# Patient Record
Sex: Female | Born: 1955 | Race: White | Hispanic: No | Marital: Married
Health system: Southern US, Community
[De-identification: ages and names within clinical notes are randomized; demographics above are authoritative.]

---

## 1999-05-31 ENCOUNTER — Other Ambulatory Visit: Admission: RE | Admit: 1999-05-31 | Discharge: 1999-05-31 | Payer: Self-pay | Admitting: *Deleted

## 1999-09-26 ENCOUNTER — Encounter: Admission: RE | Admit: 1999-09-26 | Discharge: 1999-09-26 | Payer: Self-pay | Admitting: *Deleted

## 2000-02-01 ENCOUNTER — Other Ambulatory Visit: Admission: RE | Admit: 2000-02-01 | Discharge: 2000-02-01 | Payer: Self-pay | Admitting: *Deleted

## 2000-06-26 ENCOUNTER — Other Ambulatory Visit: Admission: RE | Admit: 2000-06-26 | Discharge: 2000-06-26 | Payer: Self-pay | Admitting: *Deleted

## 2017-03-11 ENCOUNTER — Other Ambulatory Visit: Payer: Self-pay | Admitting: Sports Medicine

## 2017-03-11 ENCOUNTER — Other Ambulatory Visit: Payer: Self-pay | Admitting: Student

## 2017-03-12 ENCOUNTER — Other Ambulatory Visit: Payer: Self-pay | Admitting: Sports Medicine

## 2017-03-12 DIAGNOSIS — M25511 Pain in right shoulder: Secondary | ICD-10-CM

## 2017-03-26 ENCOUNTER — Ambulatory Visit
Admission: RE | Admit: 2017-03-26 | Discharge: 2017-03-26 | Disposition: A | Discharge status: Home / Self Care | Payer: BLUE CROSS/BLUE SHIELD | Source: Ambulatory Visit | Attending: Sports Medicine | Admitting: Sports Medicine

## 2017-03-26 DIAGNOSIS — M25511 Pain in right shoulder: Secondary | ICD-10-CM

## 2017-03-26 MED ORDER — IOPAMIDOL (ISOVUE-M 200) INJECTION 41%
20.0000 mL | Freq: Once | INTRAMUSCULAR | Status: AC
  Administered 2017-03-26: 20 mL via INTRA_ARTICULAR

## 2018-03-24 ENCOUNTER — Other Ambulatory Visit: Payer: Self-pay | Admitting: Family Medicine

## 2018-03-24 DIAGNOSIS — M25551 Pain in right hip: Secondary | ICD-10-CM

## 2018-03-29 ENCOUNTER — Other Ambulatory Visit: Payer: BLUE CROSS/BLUE SHIELD

## 2018-03-30 ENCOUNTER — Other Ambulatory Visit: Payer: Self-pay | Admitting: Family Medicine

## 2018-03-30 DIAGNOSIS — M25551 Pain in right hip: Secondary | ICD-10-CM

## 2018-03-31 ENCOUNTER — Inpatient Hospital Stay
Admission: RE | Admit: 2018-03-31 | Discharge: 2018-03-31 | Disposition: A | Discharge status: Home / Self Care | Payer: Self-pay | Source: Ambulatory Visit | Attending: Family Medicine | Admitting: Family Medicine

## 2018-04-02 ENCOUNTER — Other Ambulatory Visit: Payer: Self-pay

## 2018-04-11 ENCOUNTER — Ambulatory Visit
Admission: RE | Admit: 2018-04-11 | Discharge: 2018-04-11 | Disposition: A | Discharge status: Home / Self Care | Payer: Commercial Managed Care - PPO | Source: Ambulatory Visit | Attending: Family Medicine | Admitting: Family Medicine

## 2018-04-11 DIAGNOSIS — M25551 Pain in right hip: Secondary | ICD-10-CM

## 2019-01-21 ENCOUNTER — Telehealth: Payer: Self-pay

## 2019-01-21 NOTE — Telephone Encounter (Signed)
Referral notes received from [facility], Dr. Brett Fairy office. P: [phone] F: [phone] Notes sent to scheduling.

## 2019-12-24 IMAGING — MR MR HIP*R* W/O CM
4 of 5 series · 16 of 40 positions shown · non-contrast
Comparison: None.

CLINICAL DATA: Right hip and leg pain.

EXAM:
MR OF THE RIGHT HIP WITHOUT CONTRAST
TECHNIQUE: Multiplanar, multisequence MR imaging was performed. No intravenous
contrast was administered.

[Series 4: T1 · coronal · 4.0mm · 0.59mm/px · 3 of 19 slices shown (1 of 2)]
[im 4/19]
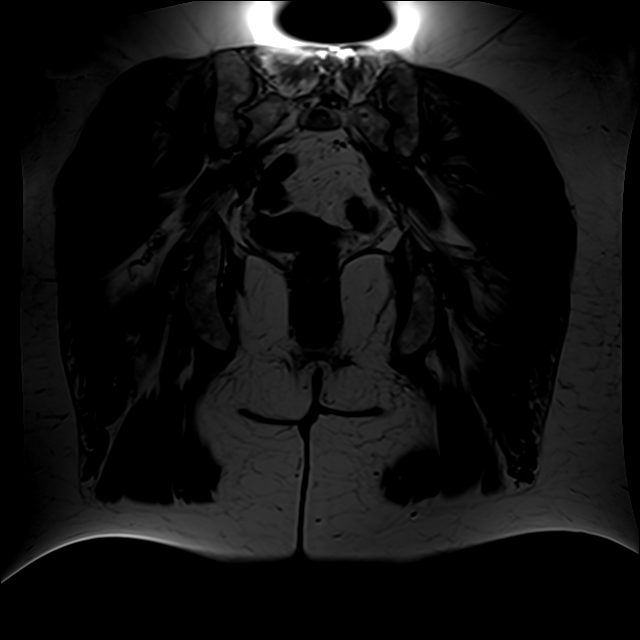
[im 10/19]
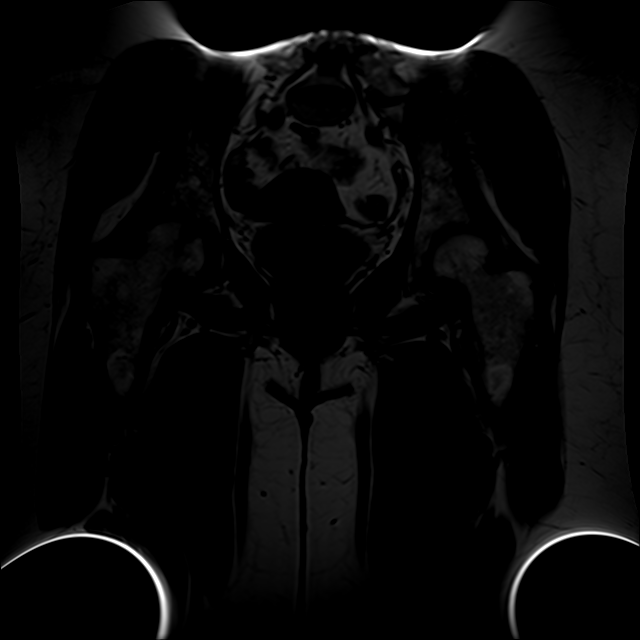
[im 16/19]
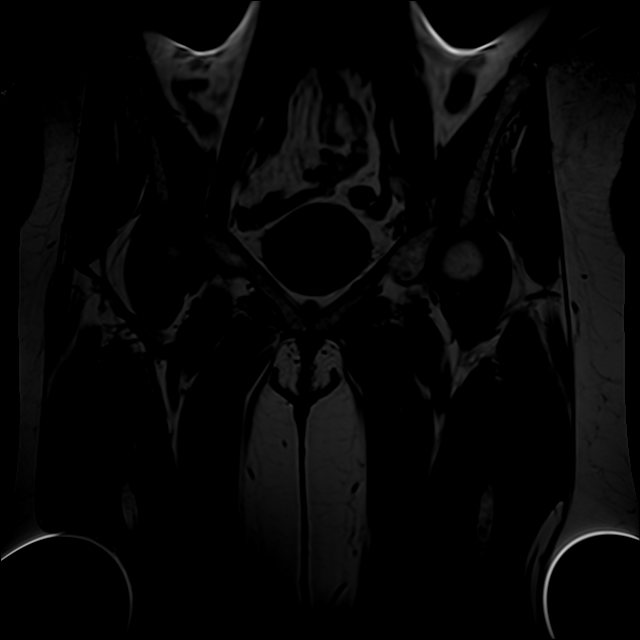

[Series 5: T2 fat-sat · axial · 4.0mm · 0.59mm/px · z∈[+0,+87]mm · 3 of 25 slices shown]
[im 4/25]
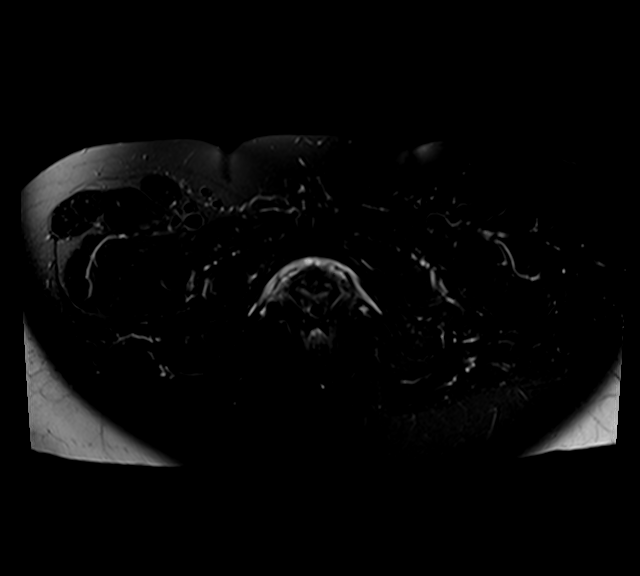
[im 13/25]
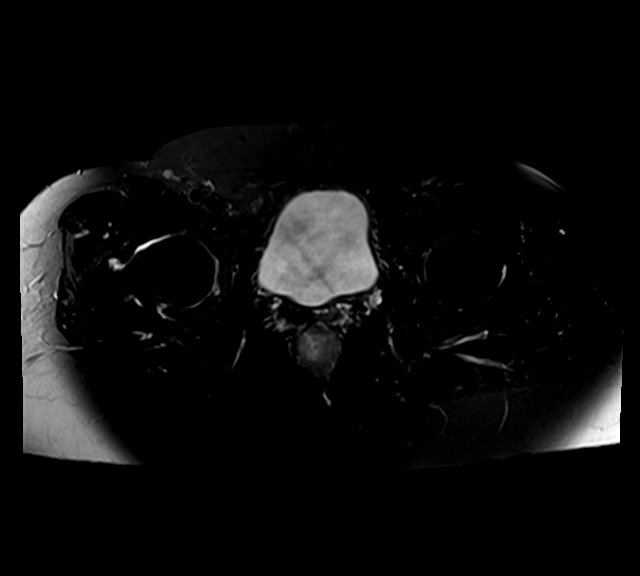
[im 22/25]
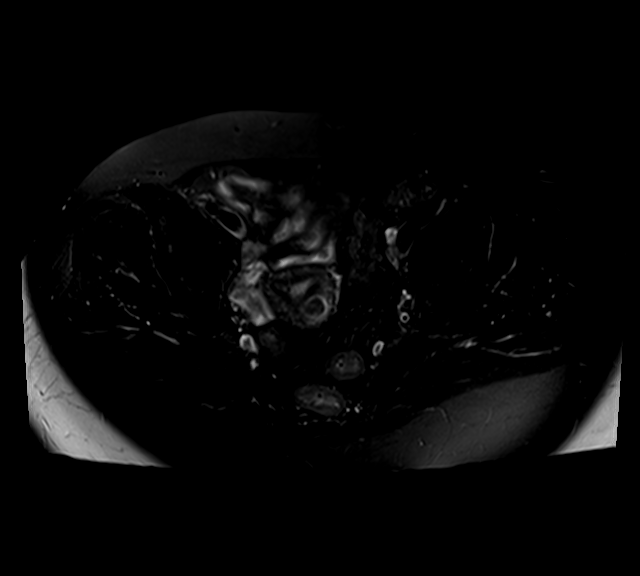

[Series 6: T1 · axial · 4.0mm · 0.59mm/px · z∈[+0,+87]mm · 3 of 25 slices shown (2 of 2)]
[im 4/25]
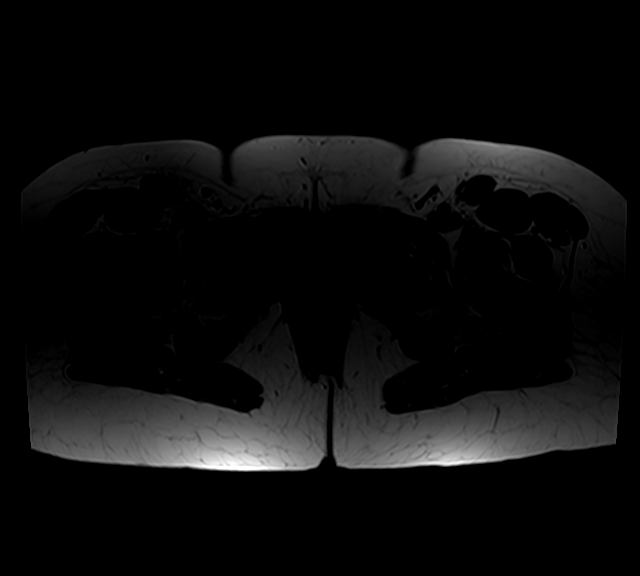
[im 13/25]
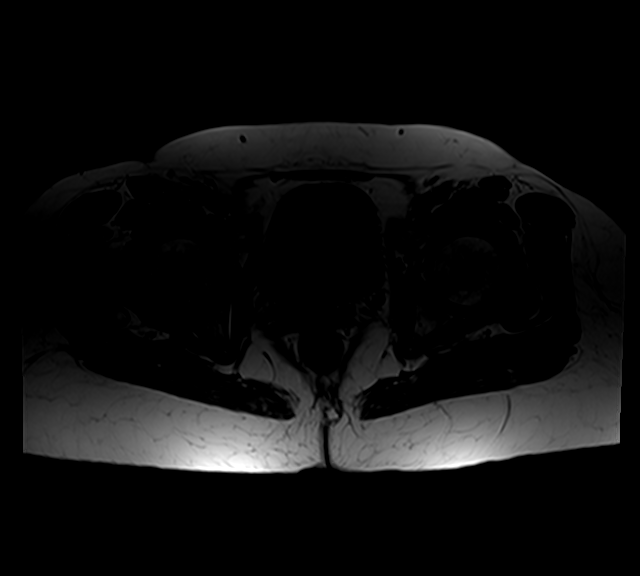
[im 22/25]
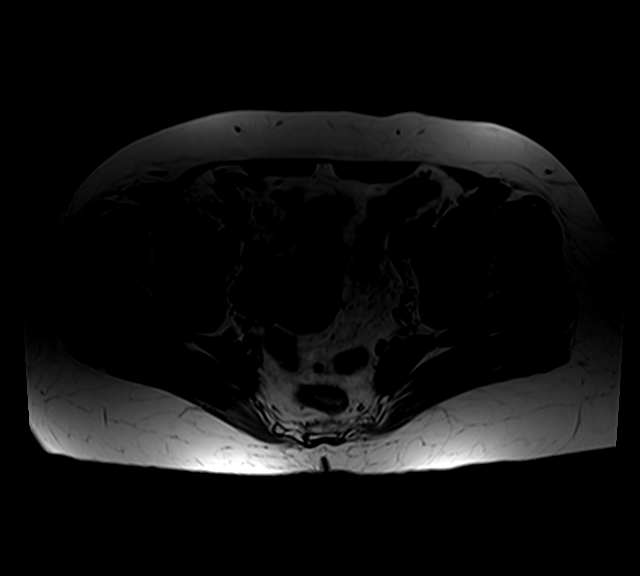

[Series 7: PD fat-sat · sagittal · 4.0mm · 0.35mm/px · 7 of 20 slices shown]
[im 1/20]
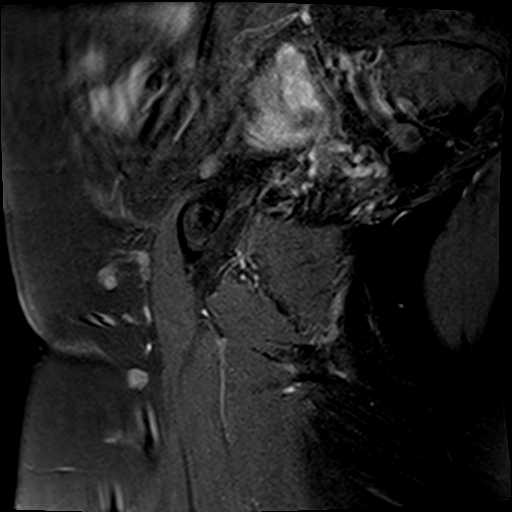
[im 4/20]
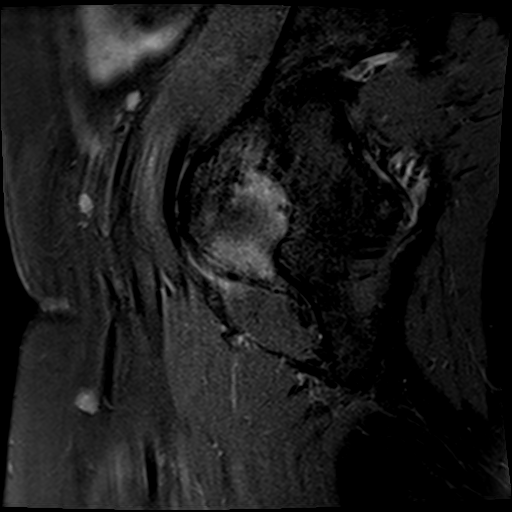
[im 7/20]
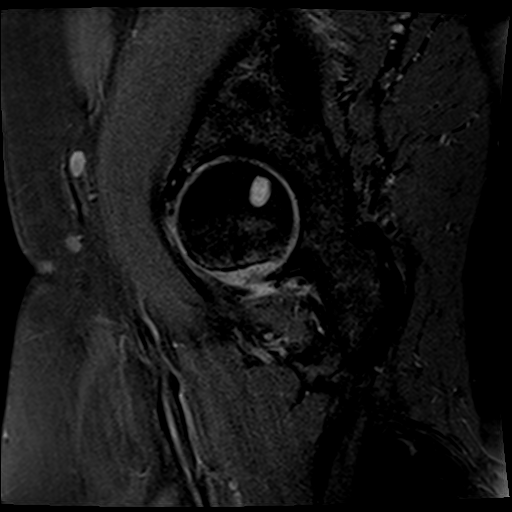
[im 10/20]
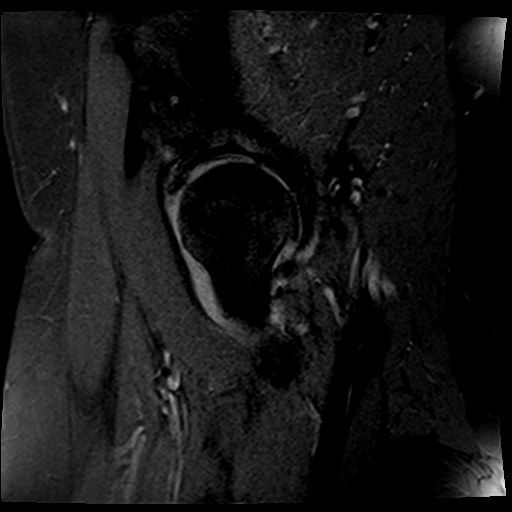
[im 13/20]
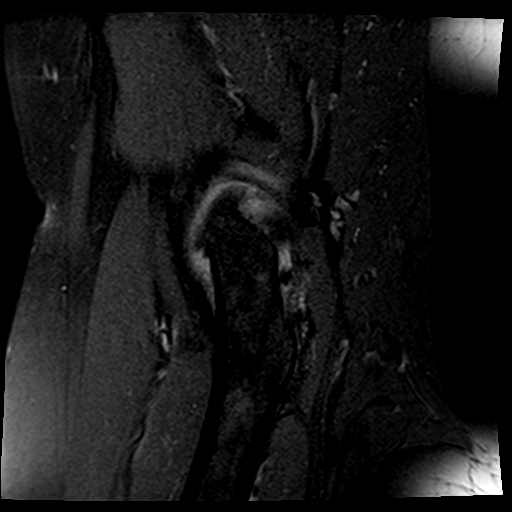
[im 16/20]
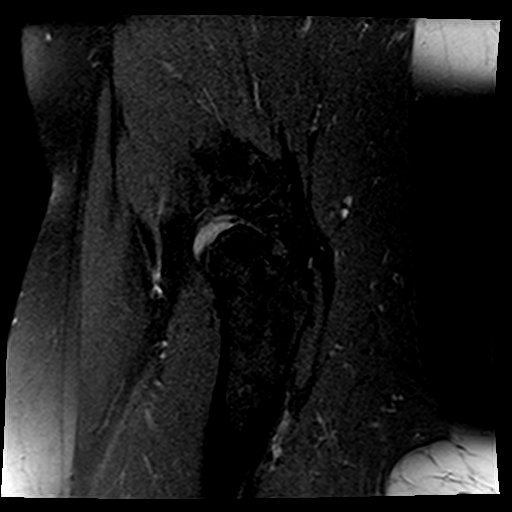
[im 20/20]
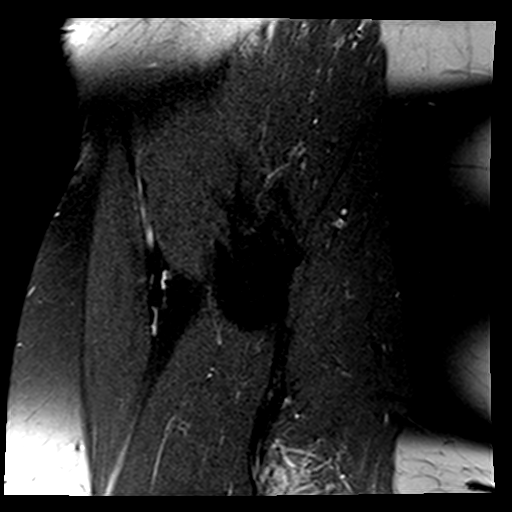

[16 of 40 positions shown; findings below may reference images not displayed]

FINDINGS: Both hips are normally located. No stress fracture or AVN. There are
moderate to advanced bilateral hip joint degenerative changes, right
greater than left with joint space narrowing, osteophytic spurring,
cartilage loss and subchondral cystic change. There is also a small
right-sided joint effusion. No periarticular fluid collections to
suggest a paralabral cyst.

The pubic symphysis and SI joints are intact. Mild degenerative
changes. No pelvic fractures or bone lesions.

The surrounding hip and pelvic musculature appear unremarkable. No
muscle tear, myositis or mass. The hamstring tendons are intact.
Mild proximal tendinopathy.

No significant peritendinosis and no trochanteric bursitis.

No significant intrapelvic abnormalities. The uterus and ovaries
appear normal. Small uterine fibroid. No inguinal mass or hernia.
IMPRESSION: 1. Moderate to advanced bilateral hip joint degenerative changes,
right greater than left as detailed above. No stress fracture or
AVN.
2. Normal appearance of the surrounding hip and pelvic musculature.
3. No significant intrapelvic abnormalities. Uterine fibroids are
noted incidentally.

## 2021-02-15 ENCOUNTER — Ambulatory Visit
Admission: RE | Admit: 2021-02-15 | Discharge: 2021-02-15 | Disposition: A | Discharge status: Home / Self Care | Payer: BC Managed Care – PPO | Source: Ambulatory Visit | Attending: Family Medicine | Admitting: Family Medicine

## 2021-02-15 ENCOUNTER — Ambulatory Visit
Admission: RE | Admit: 2021-02-15 | Discharge: 2021-02-15 | Disposition: A | Discharge status: Home / Self Care | Payer: Commercial Managed Care - PPO | Source: Ambulatory Visit | Attending: Family Medicine | Admitting: Family Medicine

## 2021-02-15 ENCOUNTER — Other Ambulatory Visit: Payer: Self-pay | Admitting: Family Medicine

## 2021-02-15 DIAGNOSIS — I878 Other specified disorders of veins: Secondary | ICD-10-CM

## 2021-02-15 DIAGNOSIS — R1909 Other intra-abdominal and pelvic swelling, mass and lump: Secondary | ICD-10-CM

## 2021-02-15 DIAGNOSIS — I729 Aneurysm of unspecified site: Secondary | ICD-10-CM

## 2022-10-30 IMAGING — US US PELVIS LIMITED
1 series · 12 of 12 positions shown · non-contrast
Comparison: None.

CLINICAL DATA: Painful left inguinal mass.

EXAM:
LIMITED ULTRASOUND OF PELVIS
TECHNIQUE: Limited transabdominal ultrasound examination of the pelvis was
performed.

[Series 2: us pelvis limited · 0.08mm/px · 12 acquisitions, 12 frames shown]
[im 1/12]
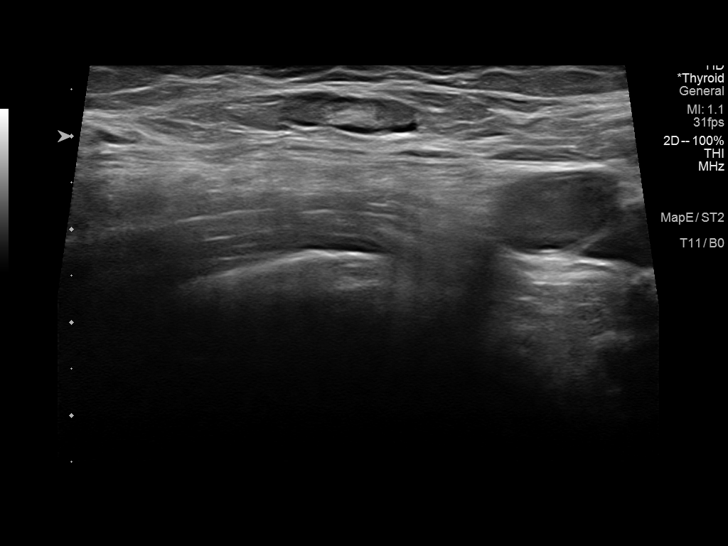
[im 2/12]
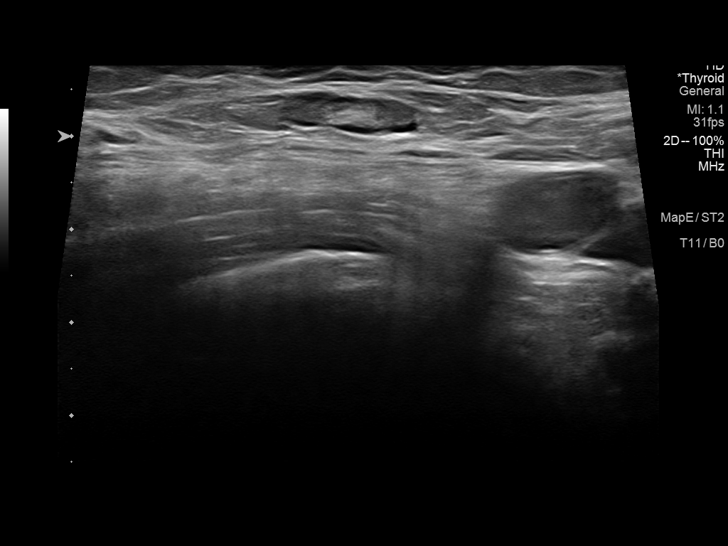
[im 3/12]
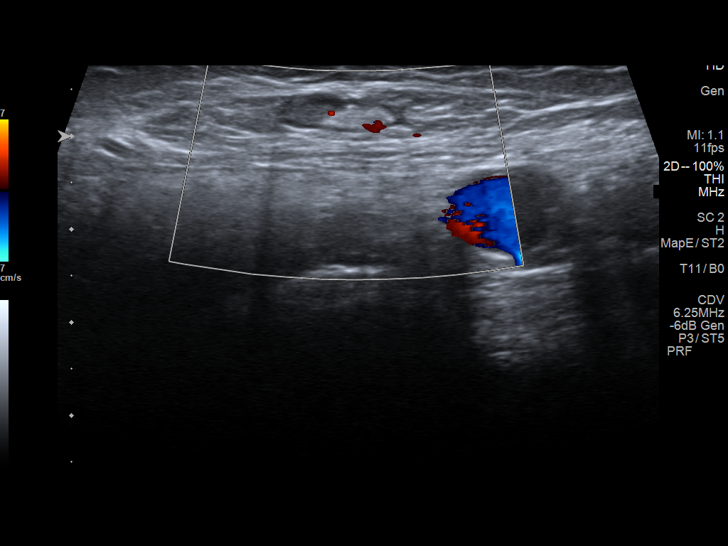
[im 4/12]
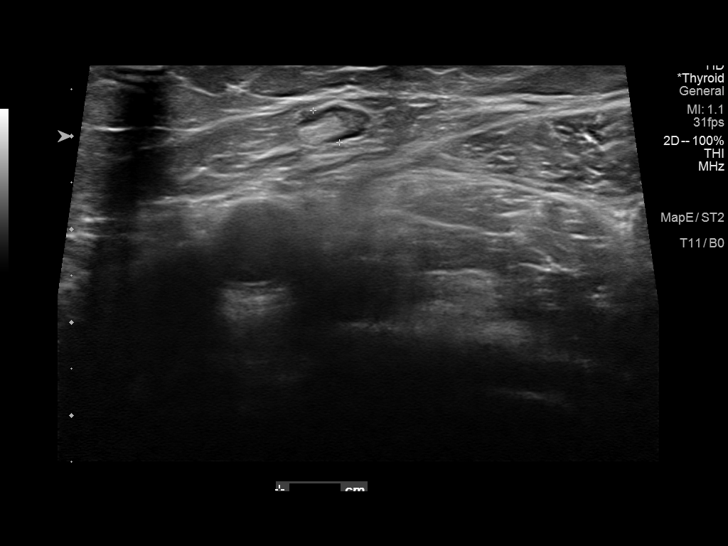
[im 5/12]
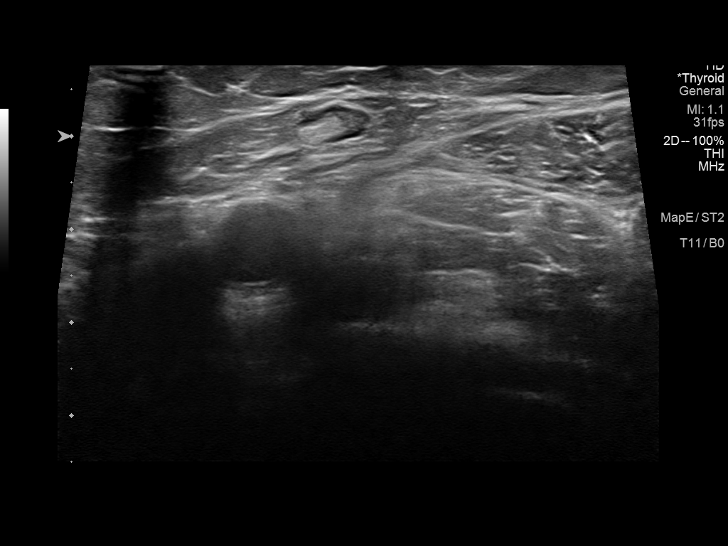
[im 6/12]
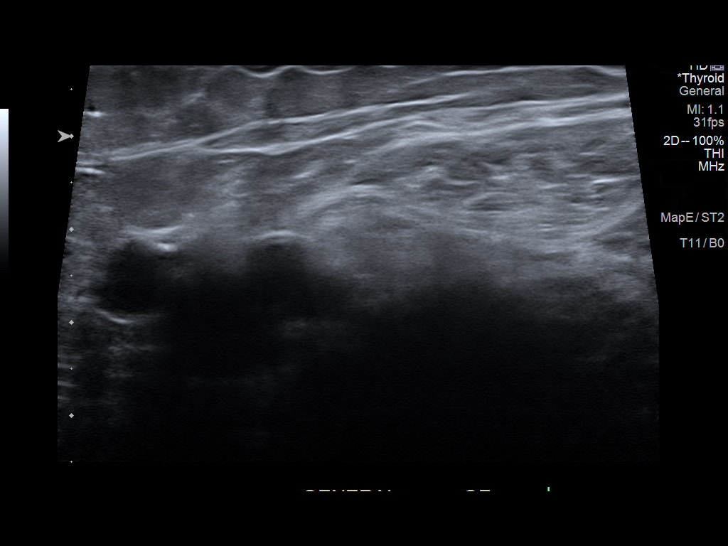
[im 7/12]
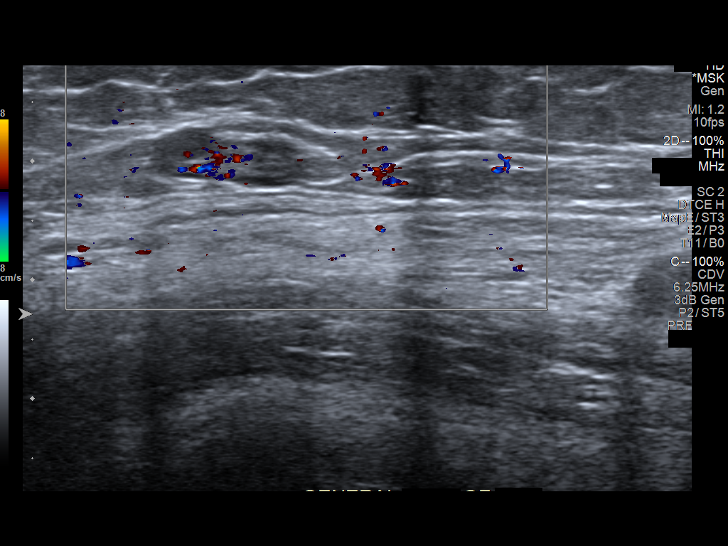
[im 8/12]
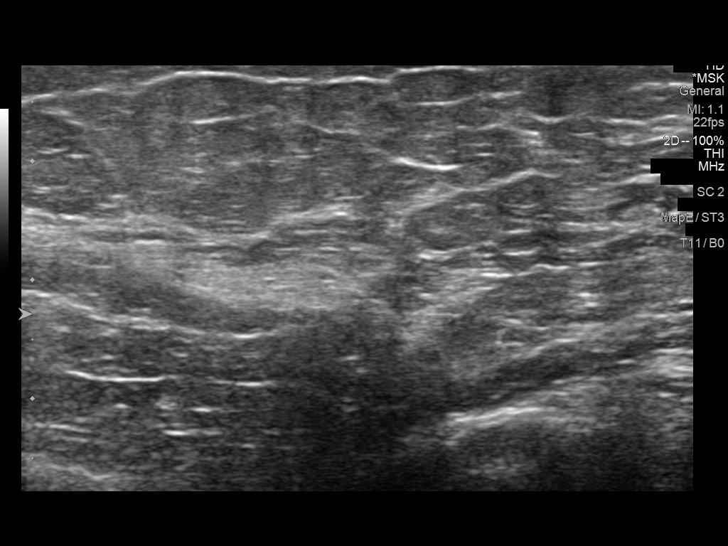
[im 9/12]
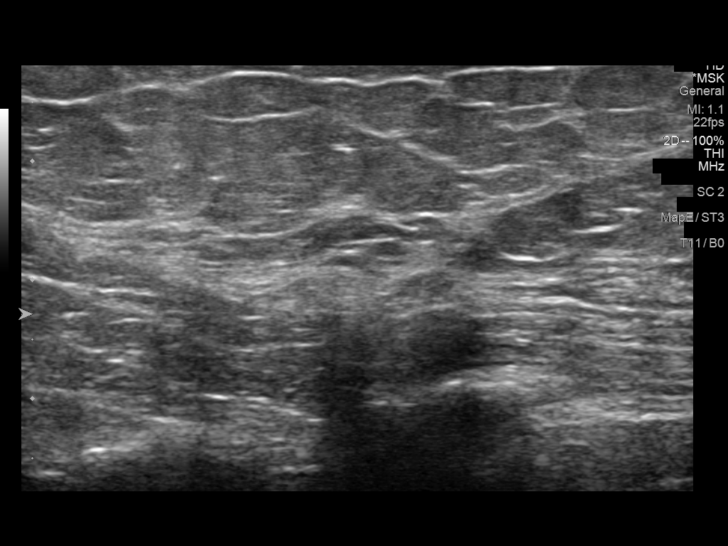
[im 10/12]
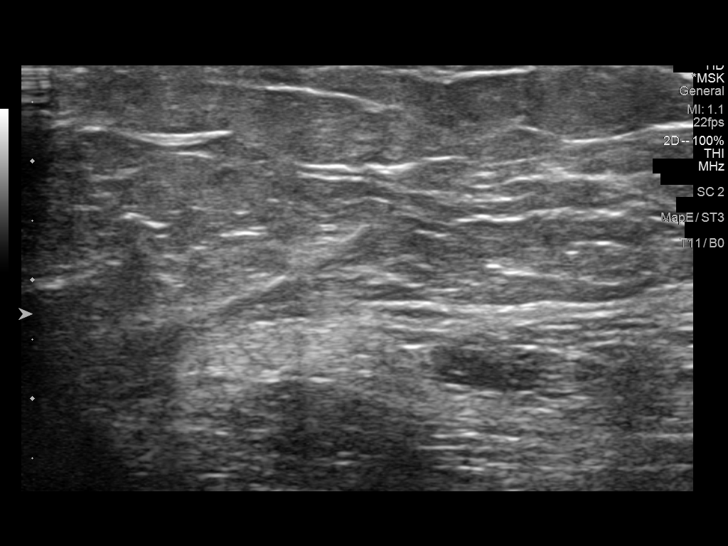
[im 11/12]
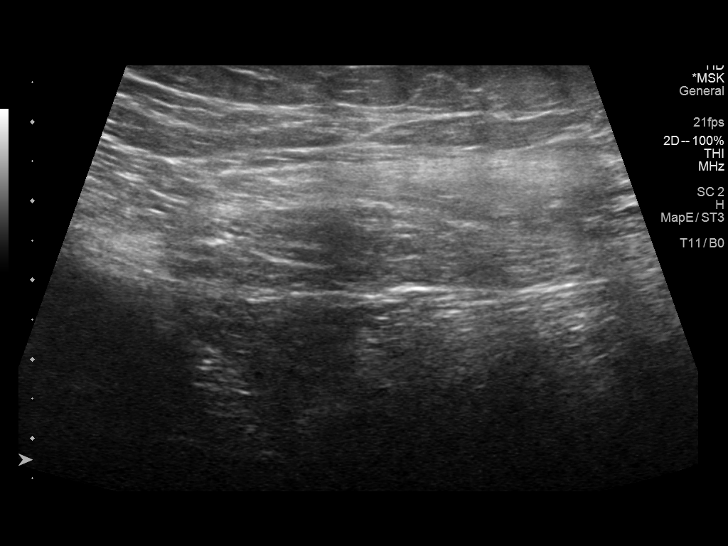
[im 12/12]
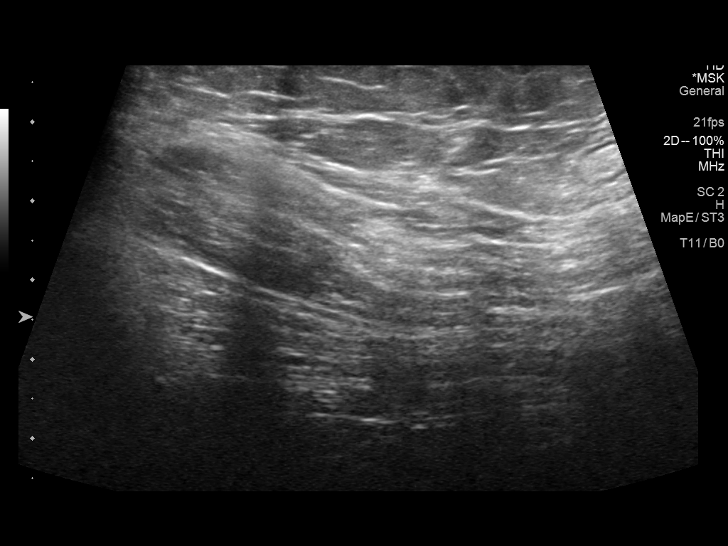

[12 of 12 positions shown; findings below may reference images not displayed]

FINDINGS: Limited sonographic evaluation was performed of the left inguinal
region. No hernia, cyst or fluid collection is noted. Left inguinal
lymph node with minor axis of 4 mm is noted as well as fatty hilum,
consistent with benign or reactive lymph node.
IMPRESSION: 4 mm left inguinal lymph node is noted as described above.
